# Patient Record
Sex: Female | Born: 1990 | Race: Black or African American | Hispanic: No | Marital: Single | Smoking: Current some day smoker
Health system: Southern US, Community
[De-identification: ages and names within clinical notes are randomized; demographics above are authoritative.]

## PROBLEM LIST (undated history)

## (undated) DIAGNOSIS — J45909 Unspecified asthma, uncomplicated: Secondary | ICD-10-CM

---

## 2014-04-25 ENCOUNTER — Emergency Department (HOSPITAL_COMMUNITY): Payer: BLUE CROSS/BLUE SHIELD

## 2014-04-25 ENCOUNTER — Encounter (HOSPITAL_COMMUNITY): Payer: Self-pay | Admitting: *Deleted

## 2014-04-25 ENCOUNTER — Emergency Department (HOSPITAL_COMMUNITY)
Admission: EM | Admit: 2014-04-25 | Discharge: 2014-04-25 | Disposition: A | Discharge status: Home / Self Care | Payer: BLUE CROSS/BLUE SHIELD | Attending: Emergency Medicine | Admitting: Emergency Medicine

## 2014-04-25 DIAGNOSIS — N83209 Unspecified ovarian cyst, unspecified side: Secondary | ICD-10-CM

## 2014-04-25 DIAGNOSIS — R102 Pelvic and perineal pain: Secondary | ICD-10-CM

## 2014-04-25 DIAGNOSIS — Z3202 Encounter for pregnancy test, result negative: Secondary | ICD-10-CM | POA: Insufficient documentation

## 2014-04-25 DIAGNOSIS — Z720 Tobacco use: Secondary | ICD-10-CM | POA: Insufficient documentation

## 2014-04-25 DIAGNOSIS — R10814 Left lower quadrant abdominal tenderness: Secondary | ICD-10-CM | POA: Diagnosis not present

## 2014-04-25 DIAGNOSIS — N830 Follicular cyst of ovary: Secondary | ICD-10-CM | POA: Insufficient documentation

## 2014-04-25 DIAGNOSIS — Z793 Long term (current) use of hormonal contraceptives: Secondary | ICD-10-CM | POA: Diagnosis not present

## 2014-04-25 DIAGNOSIS — R109 Unspecified abdominal pain: Secondary | ICD-10-CM | POA: Diagnosis present

## 2014-04-25 LAB — WET PREP, GENITAL
Clue Cells Wet Prep HPF POC: NONE SEEN
TRICH WET PREP: NONE SEEN
YEAST WET PREP: NONE SEEN

## 2014-04-25 LAB — URINALYSIS, ROUTINE W REFLEX MICROSCOPIC
BILIRUBIN URINE: NEGATIVE
GLUCOSE, UA: NEGATIVE mg/dL
Hgb urine dipstick: NEGATIVE
KETONES UR: NEGATIVE mg/dL
Leukocytes, UA: NEGATIVE
NITRITE: NEGATIVE
Protein, ur: NEGATIVE mg/dL
Specific Gravity, Urine: 1.026 (ref 1.005–1.030)
Urobilinogen, UA: 0.2 mg/dL (ref 0.0–1.0)
pH: 6 (ref 5.0–8.0)

## 2014-04-25 LAB — RPR: RPR Ser Ql: NONREACTIVE

## 2014-04-25 LAB — POC URINE PREG, ED: Preg Test, Ur: NEGATIVE

## 2014-04-25 MED ORDER — NAPROXEN 250 MG PO TABS
500.0000 mg | ORAL_TABLET | Freq: Once | ORAL | Status: AC
Start: 2014-04-25 — End: 2014-04-25
  Administered 2014-04-25: 500 mg via ORAL
  Filled 2014-04-25: qty 2

## 2014-04-25 MED ORDER — NAPROXEN 500 MG PO TABS: 500.0000 mg | ORAL_TABLET | Freq: Two times a day (BID) | ORAL | Status: AC

## 2014-04-25 MED ORDER — ONDANSETRON 4 MG PO TBDP
8.0000 mg | ORAL_TABLET | Freq: Once | ORAL | Status: AC
  Administered 2014-04-25: 8 mg via ORAL
  Filled 2014-04-25: qty 2

## 2014-04-25 MED ORDER — HYDROCODONE-ACETAMINOPHEN 5-325 MG PO TABS
1.0000 | ORAL_TABLET | Freq: Once | ORAL | Status: AC
  Administered 2014-04-25: 1 via ORAL
  Filled 2014-04-25: qty 1

## 2014-04-25 NOTE — ED Notes (Signed)
Patient with onset of lower abdominal pain after having sex tonight.  She denies any vaginal discharge.  Denies any bleeding.  She denies pain when voiding.  She denies n/v/d.  Patient last period was 2 weeks ago.  Patient is alert.  She reports she completed antibiotics for bacterial vaginosis

## 2014-04-25 NOTE — ED Notes (Signed)
Patient is currently in [place]

## 2014-04-25 NOTE — ED Provider Notes (Signed)
CSN: [id number]     Arrival date & time 04/25/14  0411 History   First MD Initiated Contact with Patient 04/25/14 0502     Chief Complaint  Patient presents with  . Abdominal Pain     (Consider location/radiation/quality/duration/timing/severity/associated sxs/prior Treatment) HPI 24 year old female presents to the emergency department from home with complaint of pelvic pain.  Patient reports that she was having intercourse, when she had onset of severe pain and they had to stop.  She reports that she was laying on her stomach when symptoms started.  LMP 2-1/2 weeks ago.  Denies previous history of similar pain.  No prior history of ovarian cyst.  No vaginal bleeding or discharge.  Patient recently completed treatment for BV History reviewed. No pertinent past medical history. History reviewed. No pertinent past surgical history. No family history on file. History  Substance Use Topics  . Smoking status: Current Every Day Smoker  . Smokeless tobacco: Not on file  . Alcohol Use: Yes   OB History    No data available     Review of Systems   See History of Present Illness; otherwise all other systems are reviewed and negative  Allergies  Pineapple  Home Medications   Prior to Admission medications   Medication Sig Start Date End Date Taking? Authorizing Provider  CRYSELLE-28 0.3-30 MG-MCG tablet Take 1 tablet by mouth daily. 03/14/14  Yes Historical Provider, MD   BP 126/63 mmHg  Pulse 77  Temp(Src) 98.7 F (37.1 C) (Oral)  Resp 20  Ht 5\' 1"  (1.549 m)  Wt 150 lb (68.04 kg)  BMI 28.36 kg/m2  SpO2 100% Physical Exam  Constitutional: She is oriented to person, place, and time. She appears well-developed and well-nourished.  HENT:  Head: Normocephalic and atraumatic.  Nose: Nose normal.  Mouth/Throat: Oropharynx is clear and moist.  Eyes: Conjunctivae and EOM are normal. Pupils are equal, round, and reactive to light.  Neck: Normal range of motion. Neck supple. No JVD  present. No tracheal deviation present. No thyromegaly present.  Cardiovascular: Normal rate, regular rhythm, normal heart sounds and intact distal pulses.  Exam reveals no gallop and no friction rub.   No murmur heard. Pulmonary/Chest: Effort normal and breath sounds normal. No stridor. No respiratory distress. She has no wheezes. She has no rales. She exhibits no tenderness.  Abdominal: Soft. Bowel sounds are normal. She exhibits no distension and no mass. There is tenderness (moderate tenderness over lower abdomen without rebound or guarding). There is no rebound and no guarding.  Genitourinary:  External genitalia normal Vagina without discharge Cervix  normal negative for cervical motion tenderness Adnexa palpated, no masses , but positive for tenderness in left adnexa noted Bladder palpated negative for tenderness Uterus palpated no masses or positive for tenderness    Musculoskeletal: Normal range of motion. She exhibits no edema or tenderness.  Lymphadenopathy:    She has no cervical adenopathy.  Neurological: She is alert and oriented to person, place, and time. She displays normal reflexes. She exhibits normal muscle tone. Coordination normal.  Skin: Skin is warm and dry. No rash noted. No erythema. No pallor.  Psychiatric: She has a normal mood and affect. Her behavior is normal. Judgment and thought content normal.  Nursing note and vitals reviewed.   ED Course  Procedures (including critical care time) Labs Review Labs Reviewed  WET PREP, GENITAL  URINALYSIS, ROUTINE W REFLEX MICROSCOPIC  HIV ANTIBODY (ROUTINE TESTING)  RPR  POC URINE PREG, ED  GC/CHLAMYDIA PROBE  AMP ([place])    Imaging Review No results found.   EKG Interpretation None      MDM   Final diagnoses:  Left adnexal tenderness   24 year old female with pelvic pain during intercourse.  Will check transvaginal ultrasound and Doppler ultrasound for ovarian torsion versus ruptured  cyst.  Care passed to Dr Silverio Lay awaiting U/s.  Olivia Mackie, MD 04/25/14 [phone]

## 2014-04-25 NOTE — ED Provider Notes (Signed)
  Physical Exam  BP 101/46 mmHg  Pulse 60  Temp(Src) 98.7 F (37.1 C) (Oral)  Resp 18  Ht 5\' 1"  (1.549 m)  Wt 150 lb (68.04 kg)  BMI 28.36 kg/m2  SpO2 98%  Physical Exam  ED Course  Procedures  MDM Care assumed at sign out from Dr. Norlene Campbelltter. Patient has acute onset LLQ pain during sex. Sign out pending US. US showed ruptured L ovarian cyst. Pain controlled with naprosyn. UA unremarkable, UCG neg. Will dc home with naprosyn.   Richardean Canalavid H Yao, MD 04/25/14 [phone]

## 2014-04-25 NOTE — Discharge Instructions (Signed)
Take naprosyn twice daily for the next few days then as needed.   Follow up with your doctor.   Return to ER if you have worse pain, vomiting, fevers.

## 2014-04-26 LAB — HIV ANTIBODY (ROUTINE TESTING W REFLEX): HIV SCREEN 4TH GENERATION: NONREACTIVE

## 2014-04-26 LAB — GC/CHLAMYDIA PROBE AMP (~~LOC~~) NOT AT ARMC
CHLAMYDIA, DNA PROBE: NEGATIVE
Neisseria Gonorrhea: NEGATIVE

## 2015-09-04 IMAGING — US US TRANSVAGINAL NON-OB
1 series · 13 of 25 positions shown · non-contrast
Comparison: None.

CLINICAL DATA: Left adnexal pain

EXAM:
TRANSABDOMINAL AND TRANSVAGINAL ULTRASOUND OF PELVIS
DOPPLER ULTRASOUND OF OVARIES
TECHNIQUE: Both transabdominal and transvaginal ultrasound examinations of the
pelvis were performed. Transabdominal technique was performed for
global imaging of the pelvis including uterus, ovaries, adnexal
regions, and pelvic cul-de-sac.
It was necessary to proceed with endovaginal exam following the
transabdominal exam to visualize the ovaries. Color and duplex
Doppler ultrasound was utilized to evaluate blood flow to the
ovaries.

[Series 1: us transvaginal non-ob · 0.18mm/px · 13 of 81 slices shown]
[im 1/81]
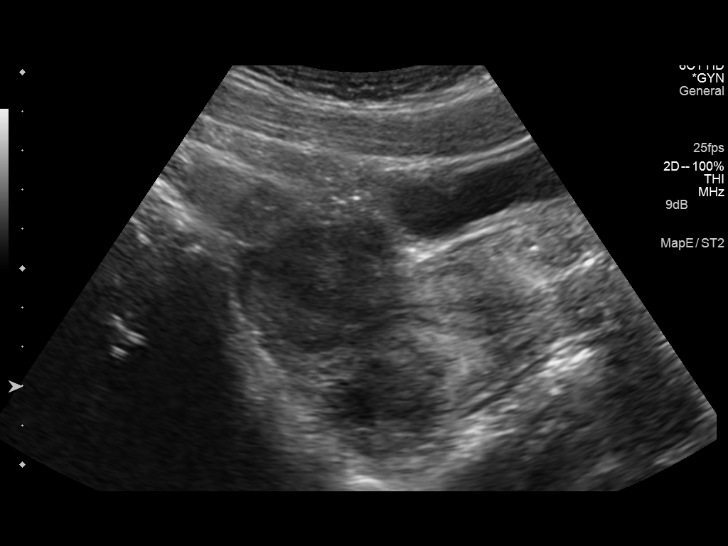
[im 7/81]
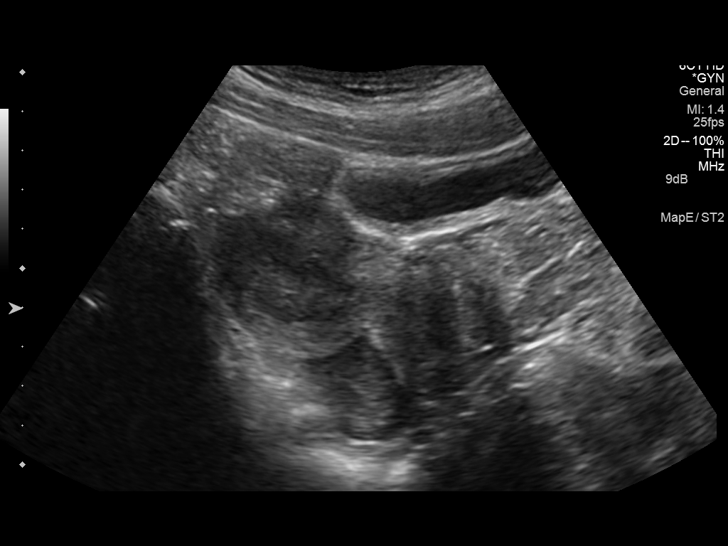
[im 14/81]
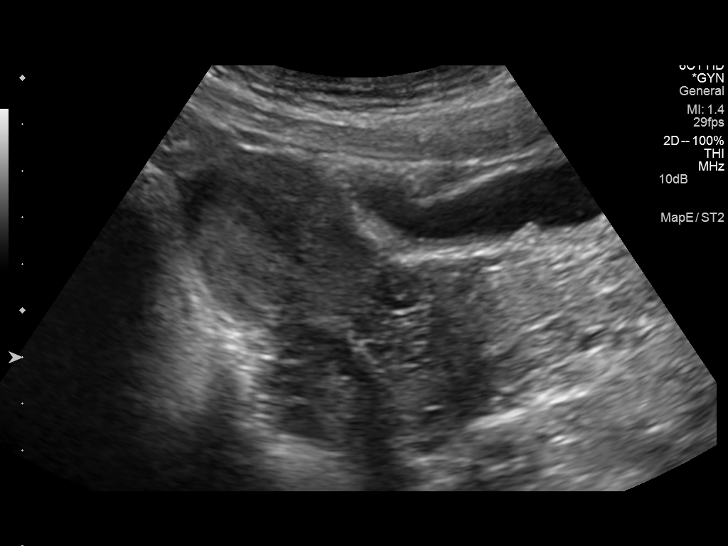
[im 21/81]
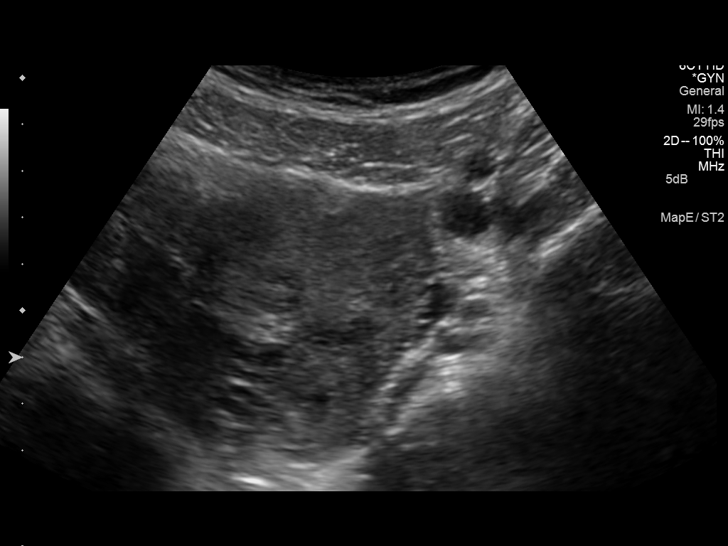
[im 27/81]
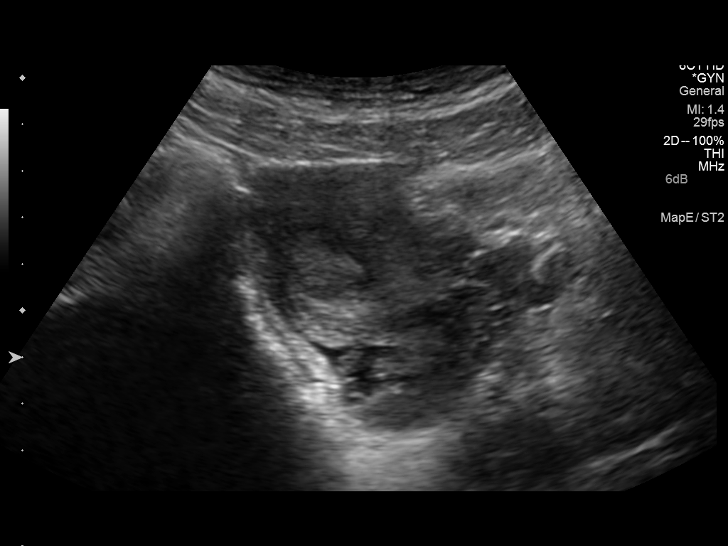
[im 34/81]
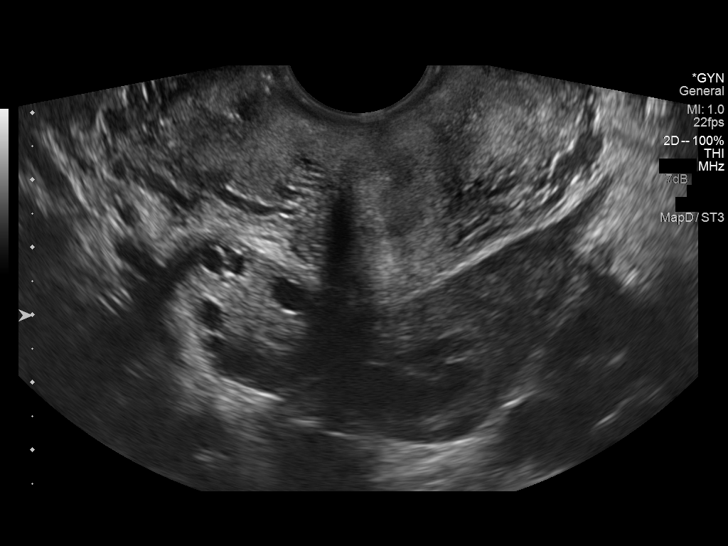
[im 41/81]
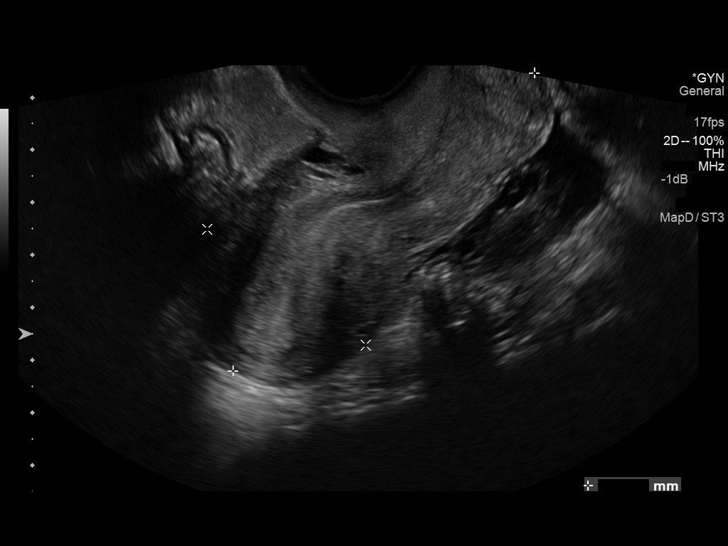
[im 47/81]
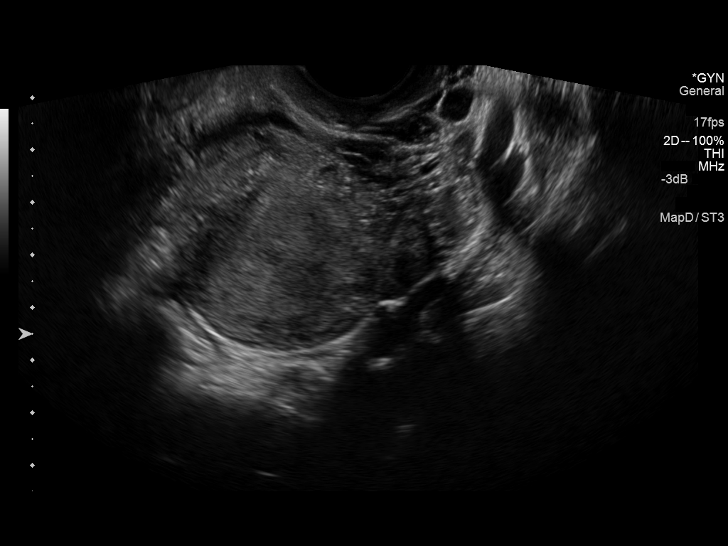
[im 54/81]
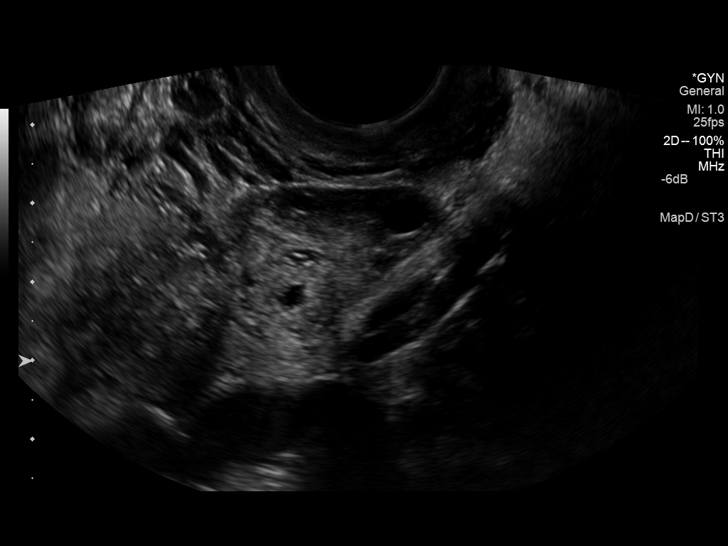
[im 61/81]
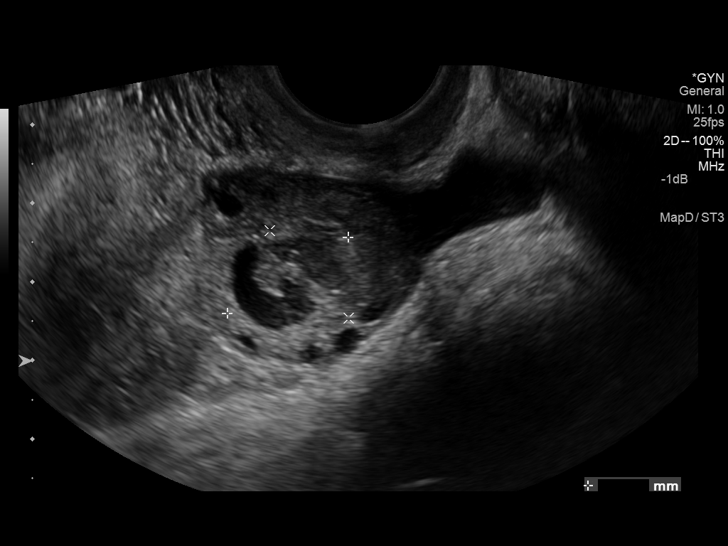
[im 67/81]
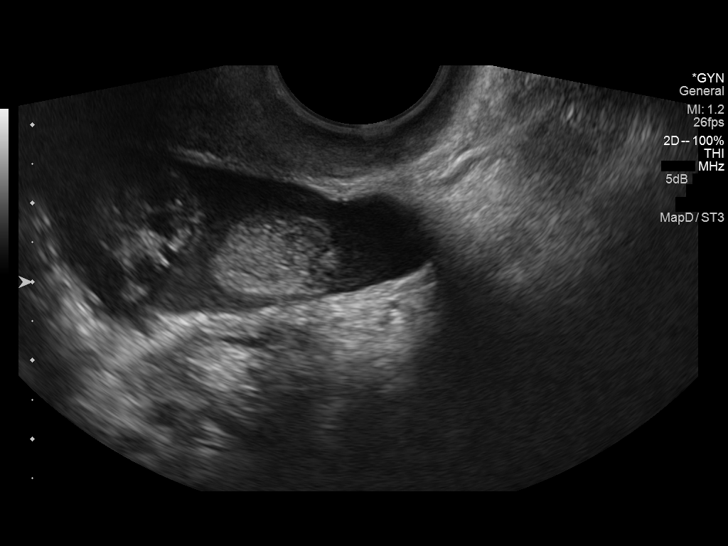
[im 74/81]
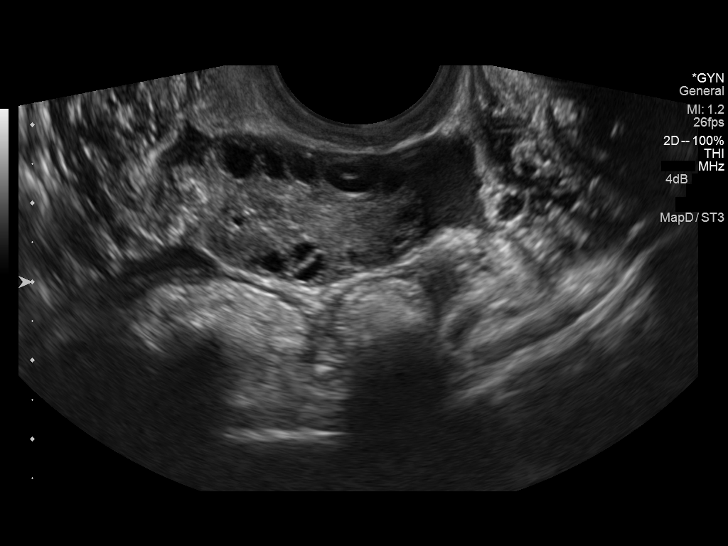
[im 81/81]
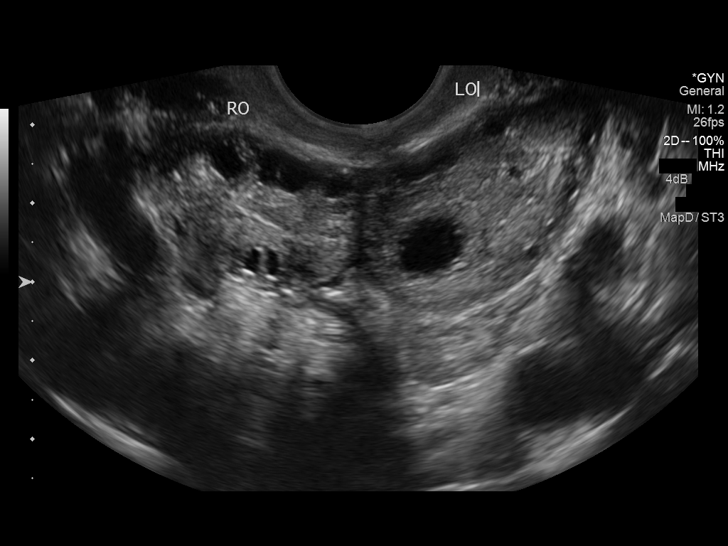

[13 of 25 positions shown; findings below may reference images not displayed]

FINDINGS: Uterus

Measurements: 8.1 x 3.7 x 3.8 cm.. No fibroids or other mass
visualized.

Endometrium

Thickness: 15 mm. This is likely related to the patient's current
menstrual status.. No focal abnormality visualized.

Right ovary

Measurements: 3.3 x 1.9 x 2.7 cm.. Normal appearance/no adnexal
mass.

Left ovary

Measurements: 2.9 x 2.8 x 4.1 cm. A 1.9 cm complex cystic lesion is
noted within the left ovary. This likely represents a hemorrhagic or
ruptured cyst. This would correspond with the patient's given
clinical symptomatology..

Pulsed Doppler evaluation of both ovaries demonstrates normal
low-resistance arterial and venous waveforms.

Other findings

A small amount of free pelvic fluid is noted.
IMPRESSION: Complex cystic lesion within the left ovary. Given the free fluid
this may represent a recently ruptured cyst. Some underlying
hemorrhage within the cyst cannot be totally excluded.

## 2015-12-15 ENCOUNTER — Ambulatory Visit (HOSPITAL_COMMUNITY)
Admission: EM | Admit: 2015-12-15 | Discharge: 2015-12-15 | Discharge status: Against Medical Advice | Payer: BLUE CROSS/BLUE SHIELD

## 2015-12-15 NOTE — ED Notes (Addendum)
Pt brought back to triage area because of SOB and coughing. Pt assessed and listened to lung sounds which were clear. Pt coughing when taking a deep breath. Pt told to go back to waiting area until called. Pt upset and talking on her cell phone in no acute distress. Pt sts she is leaving and going to the ER.

## 2016-04-19 ENCOUNTER — Encounter (HOSPITAL_COMMUNITY): Payer: Self-pay

## 2016-04-19 ENCOUNTER — Emergency Department (HOSPITAL_COMMUNITY)
Admission: EM | Admit: 2016-04-19 | Discharge: 2016-04-20 | Disposition: A | Discharge status: Home / Self Care | Payer: BLUE CROSS/BLUE SHIELD | Attending: Emergency Medicine | Admitting: Emergency Medicine

## 2016-04-19 DIAGNOSIS — R197 Diarrhea, unspecified: Secondary | ICD-10-CM | POA: Insufficient documentation

## 2016-04-19 DIAGNOSIS — N9489 Other specified conditions associated with female genital organs and menstrual cycle: Secondary | ICD-10-CM | POA: Insufficient documentation

## 2016-04-19 DIAGNOSIS — R112 Nausea with vomiting, unspecified: Secondary | ICD-10-CM | POA: Insufficient documentation

## 2016-04-19 DIAGNOSIS — F17200 Nicotine dependence, unspecified, uncomplicated: Secondary | ICD-10-CM | POA: Insufficient documentation

## 2016-04-19 LAB — COMPREHENSIVE METABOLIC PANEL
ALBUMIN: 4.4 g/dL (ref 3.5–5.0)
ALK PHOS: 50 U/L (ref 38–126)
ALT: 22 U/L (ref 14–54)
AST: 34 U/L (ref 15–41)
Anion gap: 15 (ref 5–15)
BUN: 10 mg/dL (ref 6–20)
CALCIUM: 9.2 mg/dL (ref 8.9–10.3)
CO2: 17 mmol/L — ABNORMAL LOW (ref 22–32)
CREATININE: 1.01 mg/dL — AB (ref 0.44–1.00)
Chloride: 109 mmol/L (ref 101–111)
GFR calc Af Amer: >60 mL/min (ref >60)
GFR calc non Af Amer: >60 mL/min (ref >60)
Glucose, Bld: 103 mg/dL — ABNORMAL HIGH (ref 65–99)
Potassium: 3.9 mmol/L (ref 3.5–5.1)
Sodium: 141 mmol/L (ref 135–145)
Total Bilirubin: 0.4 mg/dL (ref 0.3–1.2)
Total Protein: 7.4 g/dL (ref 6.5–8.1)

## 2016-04-19 LAB — CBC
HCT: 45 % (ref 36.0–46.0)
Hemoglobin: 14.7 g/dL (ref 12.0–15.0)
MCH: 30.1 pg (ref 26.0–34.0)
MCHC: 32.7 g/dL (ref 30.0–36.0)
MCV: 92.2 fL (ref 78.0–100.0)
Platelets: 237 10*3/uL (ref 150–400)
RBC: 4.88 MIL/uL (ref 3.87–5.11)
RDW: 12.8 % (ref 11.5–15.5)
WBC: 7.3 10*3/uL (ref 4.0–10.5)

## 2016-04-19 LAB — HCG, QUANTITATIVE, PREGNANCY

## 2016-04-19 LAB — ETHANOL: Alcohol, Ethyl (B): 25 mg/dL — ABNORMAL HIGH (ref <5)

## 2016-04-19 LAB — LIPASE, BLOOD: Lipase: 14 U/L (ref 11–51)

## 2016-04-19 MED ORDER — SODIUM CHLORIDE 0.9 % IV BOLUS (SEPSIS)
1000.0000 mL | Freq: Once | INTRAVENOUS | Status: AC
  Administered 2016-04-19: 1000 mL via INTRAVENOUS

## 2016-04-19 MED ORDER — ONDANSETRON 4 MG PO TBDP
ORAL_TABLET | ORAL | Status: AC
  Filled 2016-04-19: qty 1

## 2016-04-19 MED ORDER — ONDANSETRON 4 MG PO TBDP
4.0000 mg | ORAL_TABLET | Freq: Once | ORAL | Status: AC | PRN
  Administered 2016-04-19: 4 mg via ORAL

## 2016-04-19 MED ORDER — ONDANSETRON 4 MG PO TBDP: 4.0000 mg | ORAL_TABLET | Freq: Three times a day (TID) | ORAL | 0 refills | Status: DC | PRN

## 2016-04-19 NOTE — ED Triage Notes (Signed)
Pt endorses multiple vomiting and diarrhea episodes that began this morning. Pt states "I think I have alcohol poisoning because I drank a lot last night" VSS. Pt has emesis bag.
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
## 2016-04-19 NOTE — Discharge Instructions (Signed)
Try to stay hydrated

## 2016-11-04 ENCOUNTER — Encounter (HOSPITAL_COMMUNITY): Payer: Self-pay | Admitting: Emergency Medicine

## 2016-11-04 ENCOUNTER — Ambulatory Visit (HOSPITAL_COMMUNITY)
Admission: EM | Admit: 2016-11-04 | Discharge: 2016-11-04 | Disposition: A | Discharge status: Home / Self Care | Payer: BLUE CROSS/BLUE SHIELD | Attending: Family Medicine | Admitting: Family Medicine

## 2016-11-04 DIAGNOSIS — S0511XA Contusion of eyeball and orbital tissues, right eye, initial encounter: Secondary | ICD-10-CM | POA: Diagnosis not present

## 2016-11-04 DIAGNOSIS — Z3202 Encounter for pregnancy test, result negative: Secondary | ICD-10-CM

## 2016-11-04 DIAGNOSIS — H539 Unspecified visual disturbance: Secondary | ICD-10-CM

## 2016-11-04 LAB — POCT PREGNANCY, URINE: Preg Test, Ur: NEGATIVE
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
## 2016-11-04 NOTE — Discharge Instructions (Signed)
I am expecting this problem to resolve in the next two days.  If visual disturbance persists by Thursday afternoon, call and we will refer to Dr. Dione BoozeGroat.
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
## 2016-11-12 ENCOUNTER — Encounter (HOSPITAL_COMMUNITY): Payer: Self-pay | Admitting: *Deleted

## 2016-11-12 ENCOUNTER — Emergency Department (HOSPITAL_COMMUNITY)
Admission: EM | Admit: 2016-11-12 | Discharge: 2016-11-12 | Disposition: A | Discharge status: Home / Self Care | Payer: BLUE CROSS/BLUE SHIELD | Attending: Physician Assistant | Admitting: Physician Assistant

## 2016-11-12 ENCOUNTER — Emergency Department (HOSPITAL_COMMUNITY): Payer: BLUE CROSS/BLUE SHIELD

## 2016-11-12 DIAGNOSIS — R110 Nausea: Secondary | ICD-10-CM | POA: Diagnosis present

## 2016-11-12 DIAGNOSIS — Z79899 Other long term (current) drug therapy: Secondary | ICD-10-CM | POA: Insufficient documentation

## 2016-11-12 DIAGNOSIS — F17210 Nicotine dependence, cigarettes, uncomplicated: Secondary | ICD-10-CM | POA: Insufficient documentation

## 2016-11-12 DIAGNOSIS — R102 Pelvic and perineal pain: Secondary | ICD-10-CM | POA: Insufficient documentation

## 2016-11-12 LAB — COMPREHENSIVE METABOLIC PANEL
ALBUMIN: 4 g/dL (ref 3.5–5.0)
ALT: 41 U/L (ref 14–54)
AST: 34 U/L (ref 15–41)
Alkaline Phosphatase: 50 U/L (ref 38–126)
Anion gap: 14 (ref 5–15)
BILIRUBIN TOTAL: 0.5 mg/dL (ref 0.3–1.2)
BUN: 6 mg/dL (ref 6–20)
CHLORIDE: 105 mmol/L (ref 101–111)
CO2: 19 mmol/L — ABNORMAL LOW (ref 22–32)
Calcium: 9.2 mg/dL (ref 8.9–10.3)
Creatinine, Ser: 1 mg/dL (ref 0.44–1.00)
GFR calc Af Amer: >60 mL/min (ref >60)
GFR calc non Af Amer: >60 mL/min (ref >60)
GLUCOSE: 78 mg/dL (ref 65–99)
POTASSIUM: 4.1 mmol/L (ref 3.5–5.1)
Sodium: 138 mmol/L (ref 135–145)
TOTAL PROTEIN: 6.5 g/dL (ref 6.5–8.1)

## 2016-11-12 LAB — CBC
HEMATOCRIT: 43.2 % (ref 36.0–46.0)
HEMOGLOBIN: 13.8 g/dL (ref 12.0–15.0)
MCH: 30.3 pg (ref 26.0–34.0)
MCHC: 31.9 g/dL (ref 30.0–36.0)
MCV: 94.7 fL (ref 78.0–100.0)
Platelets: 248 10*3/uL (ref 150–400)
RBC: 4.56 MIL/uL (ref 3.87–5.11)
RDW: 12.9 % (ref 11.5–15.5)
WBC: 5.3 10*3/uL (ref 4.0–10.5)

## 2016-11-12 LAB — I-STAT BETA HCG BLOOD, ED (MC, WL, AP ONLY): I-stat hCG, quantitative: <5 m[IU]/mL (ref <5)

## 2016-11-12 LAB — WET PREP, GENITAL
CLUE CELLS WET PREP: NONE SEEN
SPERM: NONE SEEN
Trich, Wet Prep: NONE SEEN
Yeast Wet Prep HPF POC: NONE SEEN

## 2016-11-12 LAB — URINALYSIS, ROUTINE W REFLEX MICROSCOPIC
BILIRUBIN URINE: NEGATIVE
Glucose, UA: NEGATIVE mg/dL
HGB URINE DIPSTICK: NEGATIVE
KETONES UR: NEGATIVE mg/dL
Leukocytes, UA: NEGATIVE
Nitrite: NEGATIVE
PH: 6 (ref 5.0–8.0)
Protein, ur: NEGATIVE mg/dL
SPECIFIC GRAVITY, URINE: 1.023 (ref 1.005–1.030)

## 2016-11-12 LAB — LIPASE, BLOOD: Lipase: 23 U/L (ref 11–51)

## 2016-11-12 MED ORDER — ONDANSETRON 4 MG PO TBDP
4.0000 mg | ORAL_TABLET | Freq: Once | ORAL | Status: AC
  Administered 2016-11-12: 4 mg via ORAL
  Filled 2016-11-12: qty 1

## 2016-11-12 MED ORDER — OMEPRAZOLE 20 MG PO CPDR: 20.0000 mg | DELAYED_RELEASE_CAPSULE | Freq: Every day | ORAL | 0 refills | Status: AC

## 2016-11-12 MED ORDER — ONDANSETRON 4 MG PO TBDP: 4.0000 mg | ORAL_TABLET | Freq: Three times a day (TID) | ORAL | 0 refills | Status: AC | PRN

## 2016-11-12 MED ORDER — GI COCKTAIL ~~LOC~~
30.0000 mL | Freq: Once | ORAL | Status: AC
  Administered 2016-11-12: 30 mL via ORAL
  Filled 2016-11-12: qty 30

## 2016-11-12 NOTE — ED Notes (Signed)
Patient transported to US 

## 2016-11-12 NOTE — ED Provider Notes (Signed)
[facility] Provider Note   CSN: [id number] Arrival date & time: 11/12/16  1011     History   Chief Complaint Chief Complaint  Patient presents with  . Pelvic Pain  . Nausea    HPI Tracy Caldwell is a 26 y.o. female.  HPI   Patient is a pleasant 26 year old female presenting with 4-5 weeks of chronic nausea. She reports nauseated, not willing to eat very much. She reports multiple workups including right upper quadrant ultrasound for mildly elevated LFTs. Patient has not been started on any PPI. 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 Patient denies any urinary symptoms, denies any cough congestion and fever. Patient reports that she feels a tightness in her cervix. This is the abdominal pain that she feels. It is more of a pressure, pulling. Denies any new sexual  partners. The nausea she feels gotten worse.  History reviewed. No pertinent past medical history.  There are no active problems to display for this patient.   History reviewed. No pertinent surgical history.  OB History    No data available       Home Medications    Prior to Admission medications   Medication Sig Start Date End Date Taking? Authorizing Provider  CRYSELLE-28 0.3-30 MG-MCG tablet Take 1 tablet by mouth daily. 03/14/14   [provider]  naproxen (NAPROSYN) 500 MG tablet Take 1 tablet (500 mg total) by mouth 2 (two) times daily. 04/25/14   Charlynne Pander, MD    Family History History reviewed. No pertinent family history.  Social History Social History  Substance Use Topics  . Smoking status: Current Some Day Smoker  . Smokeless tobacco: Never Used  . Alcohol use Yes     Comment: on weekends     Allergies   Pineapple   Review of Systems Review of Systems  Constitutional: Positive for unexpected weight change. Negative for activity change and fever.  Respiratory: Negative for shortness of breath.   Cardiovascular:  Negative for chest pain.  Gastrointestinal: Positive for abdominal pain and nausea. Negative for diarrhea and vomiting.  Genitourinary: Positive for pelvic pain. Negative for difficulty urinating, dyspareunia and dysuria.     Physical Exam Updated Vital Signs BP 134/88 (BP Location: Left Arm)   Pulse 63   Temp 98.5 F (36.9 C) (Oral)   Resp 14   LMP 10/29/2016   SpO2 100%   Physical Exam  Constitutional: She is oriented to person, place, and time. She appears well-developed and well-nourished.  HENT:  Head: Normocephalic and atraumatic.  Eyes: Right eye exhibits no discharge.  Cardiovascular: Normal rate, regular rhythm and normal heart sounds.   No murmur heard. Pulmonary/Chest: Effort normal and breath sounds normal. She has no wheezes. She has no rales.  Abdominal: Soft. She exhibits no distension. There is no tenderness.  Slightest tenderness in the suprapubic region.  Genitourinary: Vagina normal.  Genitourinary Comments: Healthy-appearing cervix. No CMT.  Neurological: She is oriented to person, place, and time.  Skin: Skin is warm and dry. She is not diaphoretic.  Psychiatric: She has a normal mood and affect.  Nursing note and vitals reviewed.    ED Treatments / Results  Labs (all labs ordered are listed, but only abnormal results are displayed) Labs Reviewed  COMPREHENSIVE METABOLIC PANEL - Abnormal; Notable for the following:       Result Value   CO2 19 (*)    All other components within normal limits  WET PREP,  GENITAL  LIPASE, BLOOD  CBC  URINALYSIS, ROUTINE W REFLEX MICROSCOPIC  HIV ANTIBODY (ROUTINE TESTING)  RPR  I-STAT BETA HCG BLOOD, ED ([facility], [facility], [facility] ONLY)  GC/CHLAMYDIA PROBE AMP ([place]) NOT AT [facility]    EKG  EKG Interpretation None       Radiology No results found.  Procedures Procedures (including critical care time)  Medications Ordered in ED Medications - No data to display   Initial Impression / Assessment and Plan / ED  Course  I have reviewed the triage vital signs and the nursing notes.  Pertinent labs & imaging results that were available during my care of the patient were reviewed by me and considered in my medical decision making (see chart for details).    Well-appearing 26 year old female presenting with 1 month to 6 weeks of mild nausea getting worse. Patient also has a tightness in her cervix. Transvaginal ultrasound is ordered. Vaginal exam otherwise looks normal. Vital signs are normal and labs are reassuring. Patient able to take by mouth here in the emergency department. Concerned about more of a chronic gastritis caused by stress. Will start treatment with PPI and have her follow-up with a primary care physician.  Final Clinical Impressions(s) / ED Diagnoses   Final diagnoses:  None    New Prescriptions New Prescriptions   No medications on file     Abelino Derrick, MD 11/12/16 2354

## 2016-11-12 NOTE — ED Notes (Signed)
Patient visibly upset upon RN assessment. Patient reports not feeling herself over the last month and a half. She states she has been having intermittent pelvic pain and bouts of nausea/vomiting. She also endorses irregular menses and intense pain before her cycle, more so than usual cramps. Patient upset because she has been seen for same a couple times and everyone tells her she is fine. She does state that she has been more stressed out, but partly due to this not feeling well.

## 2016-11-12 NOTE — ED Triage Notes (Signed)
Pt reports not feeling well for months. Has lower abd/pelvic pain. Reports nausea, lack of appetite causing weight loss. Has been seen at pcp and had negative preg test. Denies urinary symptoms.

## 2016-11-12 NOTE — ED Notes (Signed)
  Patient verbalized understanding of discharge instructions and denies any further needs or questions at this time. VS stable. Patient ambulatory with steady gait, declined wheelchair, escorted to ED entrance.

## 2016-11-12 NOTE — ED Notes (Signed)
Pt walked outside advising it was to cold in waiting room.

## 2016-11-12 NOTE — Discharge Instructions (Signed)
Please return to your PCP this week.

## 2016-11-13 LAB — GC/CHLAMYDIA PROBE AMP (~~LOC~~) NOT AT ARMC
CHLAMYDIA, DNA PROBE: NEGATIVE
Neisseria Gonorrhea: NEGATIVE

## 2016-11-13 LAB — HIV ANTIBODY (ROUTINE TESTING W REFLEX): HIV Screen 4th Generation wRfx: NONREACTIVE

## 2016-11-13 LAB — RPR: RPR Ser Ql: NONREACTIVE

## 2017-05-24 ENCOUNTER — Emergency Department (HOSPITAL_COMMUNITY)
Admission: EM | Admit: 2017-05-24 | Discharge: 2017-05-24 | Discharge status: Against Medical Advice | Payer: BLUE CROSS/BLUE SHIELD | Attending: Emergency Medicine | Admitting: Emergency Medicine

## 2017-05-24 ENCOUNTER — Encounter (HOSPITAL_COMMUNITY): Payer: Self-pay | Admitting: Emergency Medicine

## 2017-05-24 DIAGNOSIS — N9489 Other specified conditions associated with female genital organs and menstrual cycle: Secondary | ICD-10-CM

## 2017-05-24 DIAGNOSIS — N898 Other specified noninflammatory disorders of vagina: Secondary | ICD-10-CM | POA: Diagnosis not present

## 2017-05-24 HISTORY — DX: Unspecified asthma, uncomplicated: J45.909

## 2017-05-24 NOTE — ED Provider Notes (Cosign Needed)
[facility]   CSN: [id number] Arrival date & time: 05/24/17  1736     History   Chief Complaint Chief Complaint  Patient presents with  . Cyst    HPI Tracy Caldwell is a 27 y.o. female.  Pt was seen by her gynecologist on 3/8 for a vaginal infection and a bartholin cyst.  Pt reports she was treated with an antibiotic and discharge and swelling has resolved.  Pt reports she still feels a knot in the area where cyst was.  Pt reports she was suppose to have seen her Md on Friday but did not make the appointment.  Pt reports area is still painful.    The history is provided by the patient. No language interpreter was used.    Past Medical History:  Diagnosis Date  . Asthma     There are no active problems to display for this patient.   No past surgical history on file.   OB History   None      Home Medications    Prior to Admission medications   Medication Sig Start Date End Date Taking? Authorizing Provider  [id number] 0.3-30 MG-MCG tablet Take 1 tablet by mouth daily. 03/14/14   [provider]  naproxen (NAPROSYN) 500 MG tablet Take 1 tablet (500 mg total) by mouth 2 (two) times daily. 04/25/14   Charlynne PanderYao, David Hsienta, MD  omeprazole (PRILOSEC) 20 MG capsule Take 1 capsule (20 mg total) by mouth daily. 11/12/16   Mackuen, Courteney Lyn, MD  ondansetron (ZOFRAN ODT) 4 MG disintegrating tablet Take 1 tablet (4 mg total) by mouth every 8 (eight) hours as needed for nausea or vomiting. 11/12/16   Mackuen, Cindee Saltourteney Lyn, MD    Family History No family history on file.  Social History Social History   Tobacco Use  . Smoking status: Current Some Day Smoker  . Smokeless tobacco: Never Used  Substance Use Topics  . Alcohol use: Yes    Comment: on weekends  . Drug use: No     Allergies   Pineapple   Review of Systems Review of Systems  Gastrointestinal: Negative for abdominal pain.  Genitourinary:  Positive for vaginal pain.  All other systems reviewed and are negative.    Physical Exam Updated Vital Signs BP (!) 145/81 (BP Location: Right Arm)   Pulse 63   Temp 98.2 F (36.8 C) (Oral)   Resp 20   Ht 5\' 2"  (1.575 m)   Wt 71.7 kg (158 lb)   LMP 05/12/2017   SpO2 100%   BMI 28.90 kg/m   Physical Exam  Constitutional: She is oriented to person, place, and time. She appears well-developed and well-nourished.  HENT:  Head: Normocephalic.  Eyes: EOM are normal.  Neck: Normal range of motion.  Pulmonary/Chest: Effort normal.  Abdominal: She exhibits no distension.  Genitourinary:  Genitourinary Comments: Labia no mass,  Tender area left labia,    Musculoskeletal: Normal range of motion.  Neurological: She is alert and oriented to person, place, and time.  Psychiatric: She has a normal mood and affect.  Nursing note and vitals reviewed.    ED Treatments / Results  Labs (all labs ordered are listed, but only abnormal results are displayed) Labs Reviewed  WET PREP, GENITAL  GC/CHLAMYDIA PROBE AMP ([place]) [facility]    EKG None  Radiology No results found.  Procedures Procedures (including critical care time)  Medications Ordered in ED Medications - No data  to display   Initial Impression / Assessment and Plan / ED Course  I have reviewed the triage vital signs and the nursing notes.  Pertinent labs & imaging results that were available during my care of the patient were reviewed by me and considered in my medical decision making (see chart for details).     Records reviewed from Dr. Scharlene Gloss office.  Pt had 2cm bartholin and trichomonas infection.    I reassured pt, bartolins seems to be improving.  Pt wants to have pelvic exam to make sure she does not have anything  inside.    Final Clinical Impressions(s) / ED Diagnoses   Final diagnoses:  Labial pain    ED Discharge Orders    None      Pt left after initial assessment. I returned to  room with Rn to do exam and pt had left.    Elson Areas, [place] 05/24/17 2154

## 2017-05-24 NOTE — ED Notes (Signed)
Upon arriving for pelvic exam, pt was discovered to have eloped from hospital. Per provider; EDP had explained procedure to pt but needed provider to perform, pt left before provider available.

## 2017-05-24 NOTE — ED Triage Notes (Signed)
Pt states several weeks of bartholin cyst to vaginal area, took antidotic full course. Swelling decreased but cyst is still there and causing pressure and pain.

## 2018-09-15 IMAGING — US US TRANSVAGINAL NON-OB
1 series · 13 of 25 positions shown · non-contrast
Comparison: None.

CLINICAL DATA: Pelvic pain for 1 month.

EXAM:
TRANSABDOMINAL AND TRANSVAGINAL ULTRASOUND OF PELVIS
DOPPLER ULTRASOUND OF OVARIES
TECHNIQUE: Both transabdominal and transvaginal ultrasound examinations of the
pelvis were performed. Transabdominal technique was performed for
global imaging of the pelvis including uterus, ovaries, adnexal
regions, and pelvic cul-de-sac.
It was necessary to proceed with endovaginal exam following the
transabdominal exam to visualize the adnexal structures to an
adequate degree. Color and duplex Doppler ultrasound was utilized to
evaluate blood flow to the ovaries.

[Series 1: us transvaginal non-ob · 0.20mm/px · 13 of 100 slices shown]
[im 1/100]
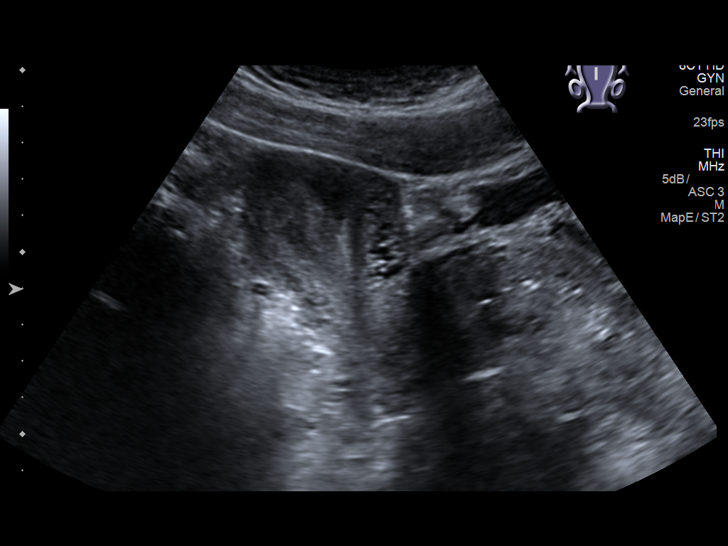
[im 9/100]
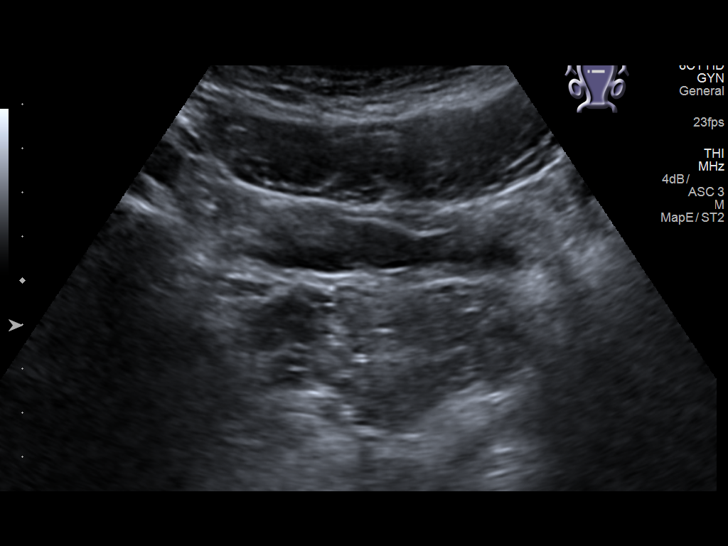
[im 17/100]
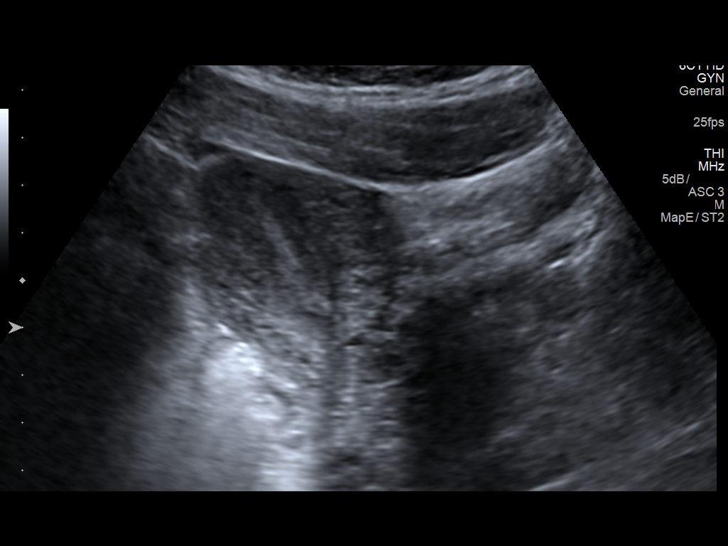
[im 25/100]
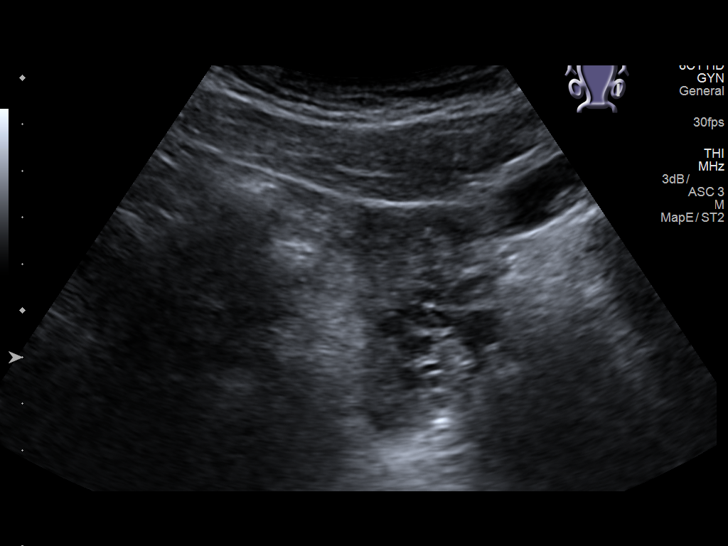
[im 34/100]
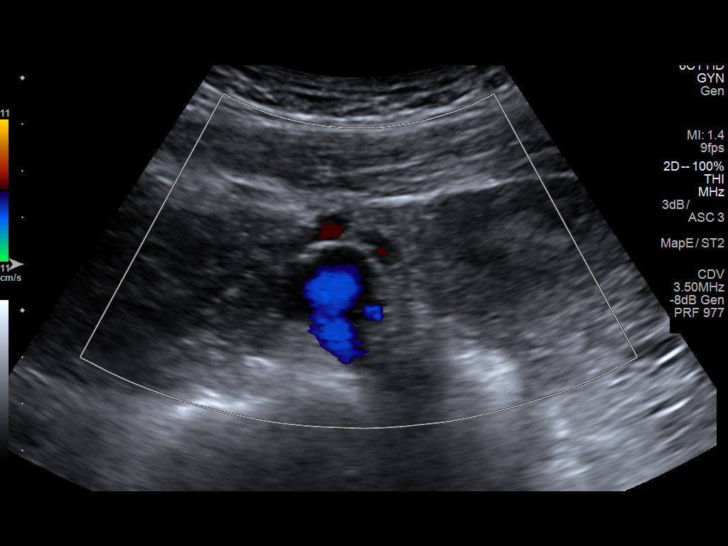
[im 42/100]
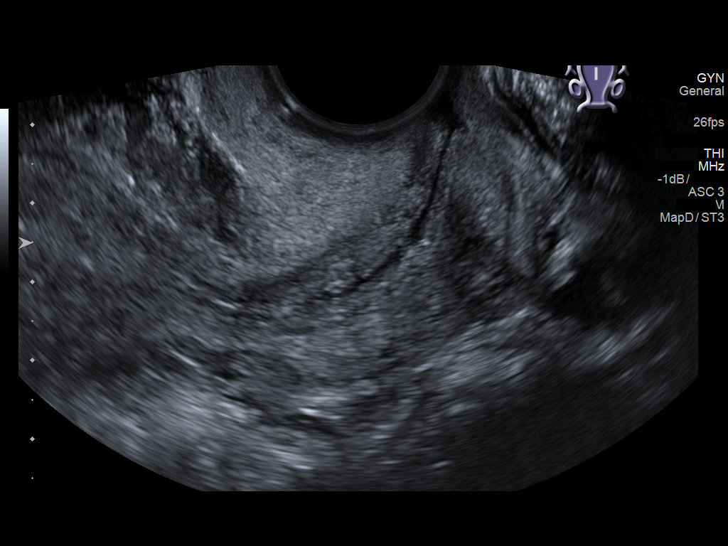
[im 50/100]
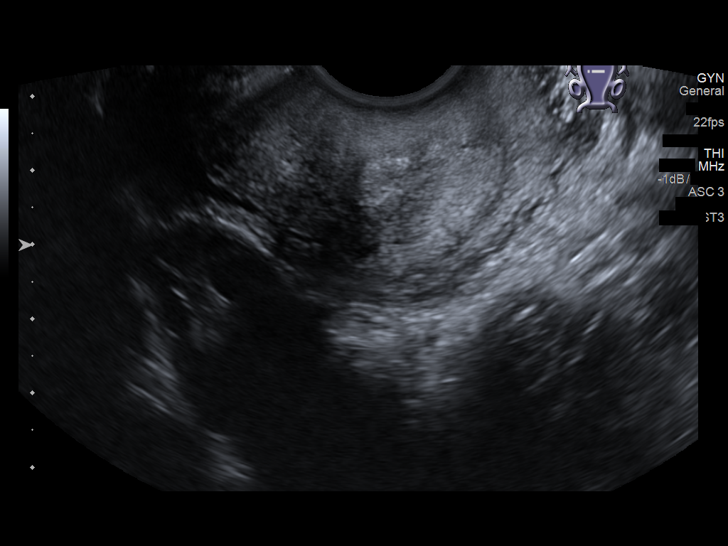
[im 58/100]
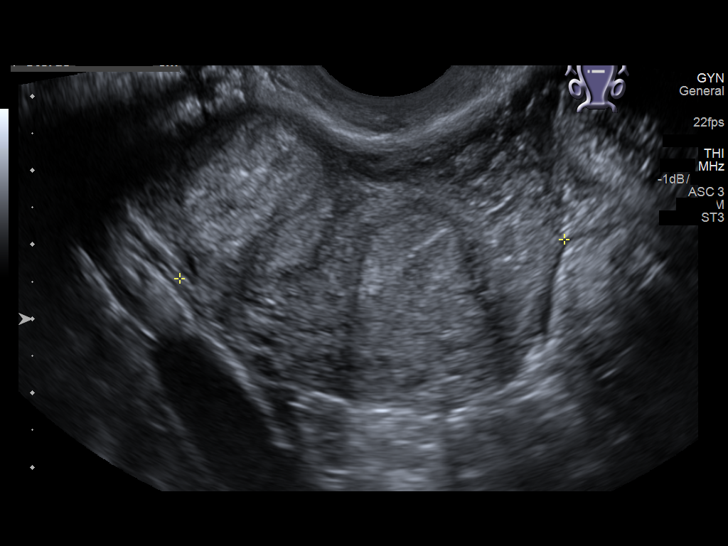
[im 67/100]
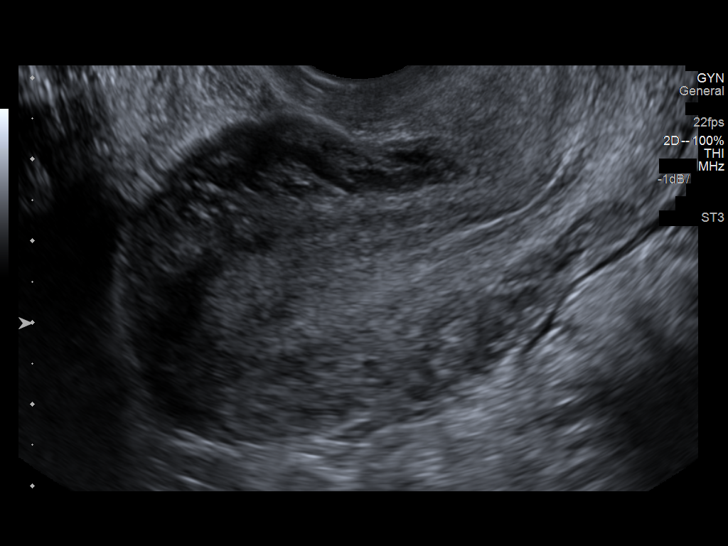
[im 75/100]
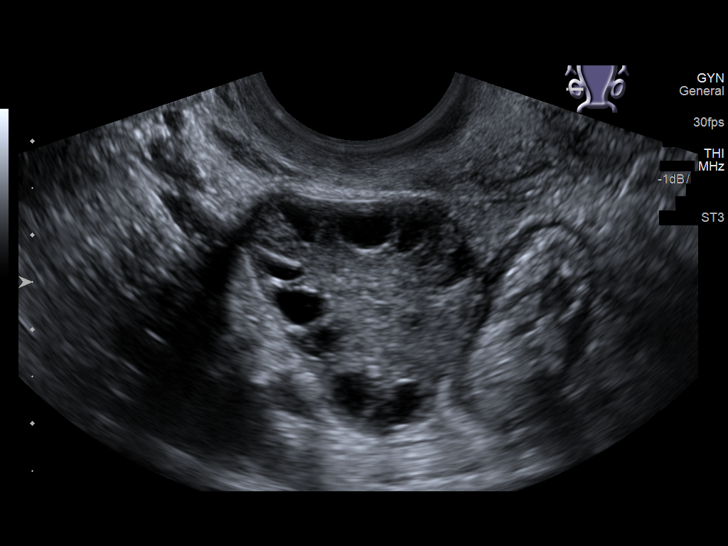
[im 83/100]
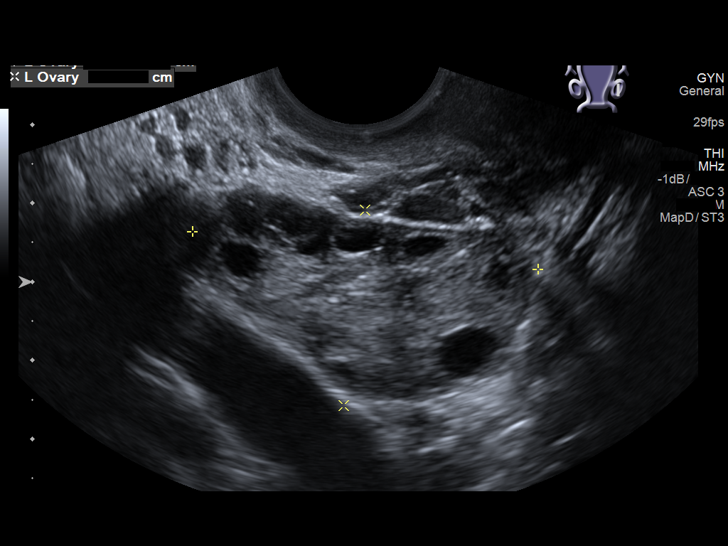
[im 91/100]
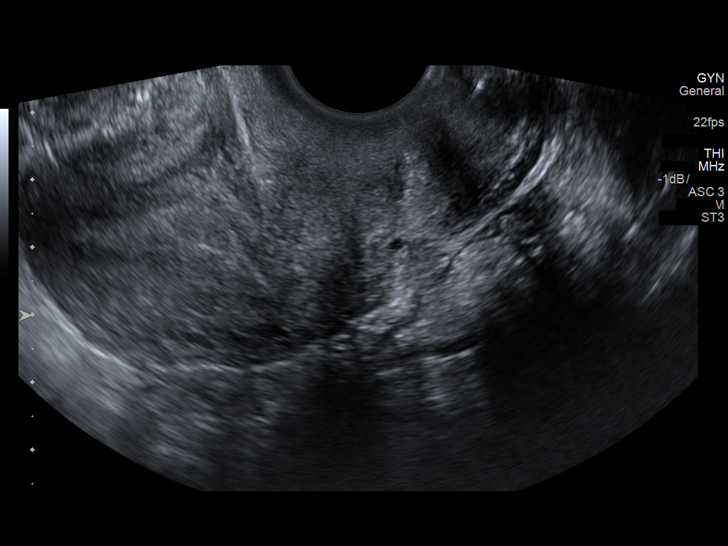
[im 100/100]
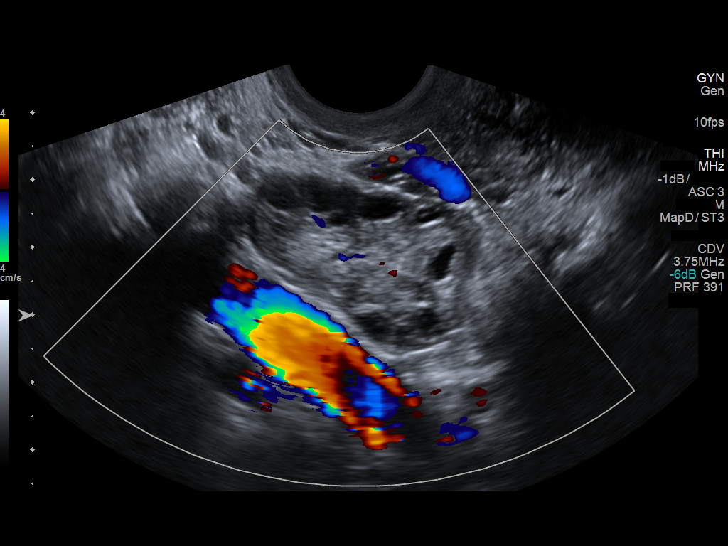

[13 of 25 positions shown; findings below may reference images not displayed]

FINDINGS: Uterus

Measurements: 7.8 x 4 x 5 cm. Mixed echogenicity mass within the
right anterior-fundal region, measuring 2.7 x 2.4 cm, compatible
with subserosal fibroid.

Endometrium

Thickness: Within normal limits at 14 mm.. No mass or fluid within
the endometrial canal.

Right ovary

Measurements: 4.1 x 2 x 2.5 cm. Normal appearance/no adnexal mass.

Left ovary

Measurements: 4.4 x 2.5 x 2.8 cm. Normal appearance/no adnexal mass.

Pulsed Doppler evaluation of both ovaries demonstrates normal
low-resistance arterial and venous waveforms.

Other findings

No abnormal free fluid. Trace free fluid in the right adnexa is
likely physiologic in nature.
IMPRESSION: 1. No acute findings. No evidence of ovarian torsion. No mass or
significant free fluid within either adnexal region.
2. Uterine fibroid, measuring 2.7 cm, located in the right
anterior-fundal region, most likely subserosal.
